# Patient Record
Sex: Male | Born: 1975 | Race: White | Hispanic: No | Marital: Married | Smoking: Never smoker
Health system: Southern US, Community
[De-identification: ages and names within clinical notes are randomized; demographics above are authoritative.]

## PROBLEM LIST (undated history)

## (undated) HISTORY — PX: ELBOW SURGERY: SHX618

## (undated) HISTORY — PX: BACK SURGERY: SHX140

## (undated) HISTORY — PX: FRACTURE SURGERY: SHX138

---

## 1998-10-27 ENCOUNTER — Ambulatory Visit (HOSPITAL_BASED_OUTPATIENT_CLINIC_OR_DEPARTMENT_OTHER): Admission: RE | Admit: 1998-10-27 | Discharge: 1998-10-27 | Payer: Self-pay | Admitting: Orthopedic Surgery

## 2005-08-08 ENCOUNTER — Observation Stay (HOSPITAL_COMMUNITY): Admission: EM | Admit: 2005-08-08 | Discharge: 2005-08-08 | Payer: Self-pay | Admitting: Emergency Medicine

## 2013-01-13 ENCOUNTER — Encounter (HOSPITAL_BASED_OUTPATIENT_CLINIC_OR_DEPARTMENT_OTHER): Payer: Self-pay | Admitting: *Deleted

## 2013-01-13 ENCOUNTER — Emergency Department (HOSPITAL_BASED_OUTPATIENT_CLINIC_OR_DEPARTMENT_OTHER)
Admission: EM | Admit: 2013-01-13 | Discharge: 2013-01-13 | Disposition: A | Discharge status: Home / Self Care | Payer: BC Managed Care – PPO | Attending: Emergency Medicine | Admitting: Emergency Medicine

## 2013-01-13 DIAGNOSIS — R51 Headache: Secondary | ICD-10-CM | POA: Insufficient documentation

## 2013-01-13 DIAGNOSIS — Z8709 Personal history of other diseases of the respiratory system: Secondary | ICD-10-CM | POA: Insufficient documentation

## 2013-01-13 DIAGNOSIS — R110 Nausea: Secondary | ICD-10-CM | POA: Insufficient documentation

## 2013-01-13 MED ORDER — DIPHENHYDRAMINE HCL 50 MG/ML IJ SOLN
25.0000 mg | Freq: Once | INTRAMUSCULAR | Status: AC
  Administered 2013-01-13: 25 mg via INTRAVENOUS
  Filled 2013-01-13: qty 1

## 2013-01-13 MED ORDER — KETOROLAC TROMETHAMINE 30 MG/ML IJ SOLN
30.0000 mg | Freq: Once | INTRAMUSCULAR | Status: AC
  Administered 2013-01-13: 30 mg via INTRAVENOUS
  Filled 2013-01-13: qty 1

## 2013-01-13 MED ORDER — SODIUM CHLORIDE 0.9 % IV BOLUS (SEPSIS)
1000.0000 mL | Freq: Once | INTRAVENOUS | Status: AC
  Administered 2013-01-13: 1000 mL via INTRAVENOUS

## 2013-01-13 MED ORDER — AMOXICILLIN 500 MG PO CAPS: 500.0000 mg | ORAL_CAPSULE | Freq: Three times a day (TID) | ORAL | Status: DC

## 2013-01-13 MED ORDER — METOCLOPRAMIDE HCL 5 MG/ML IJ SOLN
10.0000 mg | Freq: Once | INTRAMUSCULAR | Status: AC
  Administered 2013-01-13: 10 mg via INTRAVENOUS
  Filled 2013-01-13: qty 2

## 2013-01-13 NOTE — ED Notes (Signed)
Pt describes headache "behind eyes" x 5 days. +nausea+flashes "unable to function"+PERL. States was dx'd last week with sinus inf and bronchitis. Given injection and told to use OTC meds, but not helping.

## 2013-01-13 NOTE — ED Provider Notes (Signed)
History  This chart was scribed for Ethelda Chick, MD by Shari Heritage, ED Scribe. The patient was seen in room MH03/MH03. Patient's care was started at 1520.   CSN: [id number]  Arrival date & time 01/13/13  1345   First MD Initiated Contact with Patient 01/13/13 1520      Chief Complaint  Patient presents with  . Headache     Patient is a 37 y.o. male presenting with headaches. The history is provided by the patient. No language interpreter was used.  Headache Pain location:  Frontal Radiates to:  Does not radiate Onset quality:  Gradual Duration:  5 days Timing:  Constant Progression:  Worsening Chronicity:  New Ineffective treatments:  NSAIDs Associated symptoms: nausea   Nausea:    Severity:  Mild   Duration:  5 days    HPI Comments: DAVAUGHN HILLYARD is a 37 y.o. male who presents to the Emergency Department complaining of gradually worsening, moderate to severe headache behind eyes that is worse on the right onset 5 days ago. There is associated photosensitivity and nausea. Patient states that he was treated recently for a sinus infection and bronchitis with azithromycin which he finished more than 1 week ago. Patient says that sinusitis symptoms improved. He states that his headache began 5 days ago and he returned to his PCP for follow up. He was given Ultram in his PCP's office and was instructed to take ibuprofen 2-3 times per day, but he states headaches have persisted and have actually gotten worse. Patient denies vomiting or fever. Patient reports no history of similar headaches. He does not smoke.  History reviewed. No pertinent past medical history.  Past Surgical History  Procedure Laterality Date  . Elbow surgery    . Fracture surgery      History reviewed. No pertinent family history.  History  Substance Use Topics  . Smoking status: Never Smoker   . Smokeless tobacco: Not on file  . Alcohol Use: Yes      Review of Systems  Gastrointestinal:  Positive for nausea.  Neurological: Positive for headaches.  All other systems reviewed and are negative.    Allergies  Review of patient's allergies indicates no known allergies.  Home Medications   Current Outpatient Rx  Name  Route  Sig  Dispense  Refill  . amoxicillin (AMOXIL) 500 MG capsule   Oral   Take 1 capsule (500 mg total) by mouth 3 (three) times daily.   63 capsule   0     Triage Vitals: BP 151/90  Pulse 79  Temp(Src) 98.4 F (36.9 C) (Oral)  Resp 16  Ht 6' (1.829 m)  Wt 205 lb (92.987 kg)  BMI 27.8 kg/m2  SpO2 100%  Physical Exam  Nursing note and vitals reviewed. Constitutional: He is oriented to person, place, and time. He appears well-developed and well-nourished.  HENT:  Head: Normocephalic and atraumatic.  Eyes: Conjunctivae and EOM are normal. Pupils are equal, round, and reactive to light.  Neck: Normal range of motion. Neck supple.  Cardiovascular: Normal rate, regular rhythm and normal heart sounds.   Pulmonary/Chest: Effort normal and breath sounds normal.  Abdominal: Soft. Bowel sounds are normal.  Musculoskeletal: Normal range of motion.  Neurological: He is alert and oriented to person, place, and time.  5/5 strength throughout. CN 2-12 tested and intact. Sensation intact.  Skin: Skin is warm and dry.  Psychiatric: He has a normal mood and affect.    ED Course  Procedures (including  critical care time) DIAGNOSTIC STUDIES: Oxygen Saturation is 100% on room air, normal by my interpretation.    COORDINATION OF CARE: 3:45 PM- Patient informed of current plan for treatment and evaluation and agrees with plan at this time.      Labs Reviewed - No data to display No results found.   1. Headache       MDM  Pt presenting with headache behind his eyes over the past several days.  Neurologic exam intact.  Has been treated with zpack for sinusitis approx 2 weeks ago.  Pt feels much improved after migraine cocktail.  Low suspicion for  [facility] or other intracranial pathology, no meningismus.  Will give course of amoxicillin x 3 weeks in case of sinusitis.  Discharged with strict return precautions.  Pt agreeable with plan.     I personally performed the services described in this documentation, which was scribed in my presence. The recorded information has been reviewed and is accurate.    Ethelda Chick, MD 01/13/13 [phone]

## 2013-05-13 ENCOUNTER — Other Ambulatory Visit: Payer: Self-pay | Admitting: Neurological Surgery

## 2013-05-13 DIAGNOSIS — M545 Low back pain: Secondary | ICD-10-CM

## 2013-05-13 DIAGNOSIS — M5432 Sciatica, left side: Secondary | ICD-10-CM

## 2013-05-15 ENCOUNTER — Other Ambulatory Visit: Payer: BC Managed Care – PPO

## 2020-06-19 ENCOUNTER — Emergency Department (HOSPITAL_BASED_OUTPATIENT_CLINIC_OR_DEPARTMENT_OTHER): Payer: BC Managed Care – PPO

## 2020-06-19 ENCOUNTER — Other Ambulatory Visit: Payer: Self-pay

## 2020-06-19 ENCOUNTER — Encounter (HOSPITAL_BASED_OUTPATIENT_CLINIC_OR_DEPARTMENT_OTHER): Payer: Self-pay | Admitting: Emergency Medicine

## 2020-06-19 ENCOUNTER — Emergency Department (HOSPITAL_BASED_OUTPATIENT_CLINIC_OR_DEPARTMENT_OTHER)
Admission: EM | Admit: 2020-06-19 | Discharge: 2020-06-19 | Disposition: A | Discharge status: Home / Self Care | Payer: BC Managed Care – PPO | Attending: Emergency Medicine | Admitting: Emergency Medicine

## 2020-06-19 DIAGNOSIS — K529 Noninfective gastroenteritis and colitis, unspecified: Secondary | ICD-10-CM | POA: Diagnosis not present

## 2020-06-19 DIAGNOSIS — R1084 Generalized abdominal pain: Secondary | ICD-10-CM | POA: Diagnosis present

## 2020-06-19 LAB — COMPREHENSIVE METABOLIC PANEL
ALT: 25 U/L (ref 0–44)
AST: 21 U/L (ref 15–41)
Albumin: 4.5 g/dL (ref 3.5–5.0)
Alkaline Phosphatase: 41 U/L (ref 38–126)
Anion gap: 10 (ref 5–15)
BUN: 25 mg/dL — ABNORMAL HIGH (ref 6–20)
CO2: 29 mmol/L (ref 22–32)
Calcium: 9.3 mg/dL (ref 8.9–10.3)
Chloride: 101 mmol/L (ref 98–111)
Creatinine, Ser: 1.26 mg/dL — ABNORMAL HIGH (ref 0.61–1.24)
GFR calc Af Amer: >60 mL/min (ref >60)
GFR calc non Af Amer: >60 mL/min (ref >60)
Glucose, Bld: 105 mg/dL — ABNORMAL HIGH (ref 70–99)
Potassium: 4 mmol/L (ref 3.5–5.1)
Sodium: 140 mmol/L (ref 135–145)
Total Bilirubin: 0.4 mg/dL (ref 0.3–1.2)
Total Protein: 7 g/dL (ref 6.5–8.1)

## 2020-06-19 LAB — URINALYSIS, ROUTINE W REFLEX MICROSCOPIC
Bilirubin Urine: NEGATIVE
Glucose, UA: NEGATIVE mg/dL
Hgb urine dipstick: NEGATIVE
Ketones, ur: NEGATIVE mg/dL
Leukocytes,Ua: NEGATIVE
Nitrite: NEGATIVE
Protein, ur: NEGATIVE mg/dL
Specific Gravity, Urine: >1.03 — ABNORMAL HIGH (ref 1.005–1.030)
pH: 6 (ref 5.0–8.0)

## 2020-06-19 LAB — CBC
HCT: 45.6 % (ref 39.0–52.0)
Hemoglobin: 15.4 g/dL (ref 13.0–17.0)
MCH: 30.8 pg (ref 26.0–34.0)
MCHC: 33.8 g/dL (ref 30.0–36.0)
MCV: 91.2 fL (ref 80.0–100.0)
Platelets: 223 10*3/uL (ref 150–400)
RBC: 5 MIL/uL (ref 4.22–5.81)
RDW: 12.5 % (ref 11.5–15.5)
WBC: 12.3 10*3/uL — ABNORMAL HIGH (ref 4.0–10.5)
nRBC: 0 % (ref 0.0–0.2)

## 2020-06-19 LAB — DIFFERENTIAL
Abs Immature Granulocytes: 0.04 10*3/uL (ref 0.00–0.07)
Basophils Absolute: 0.1 10*3/uL (ref 0.0–0.1)
Basophils Relative: 1 %
Eosinophils Absolute: 0.2 10*3/uL (ref 0.0–0.5)
Eosinophils Relative: 2 %
Immature Granulocytes: 0 %
Lymphocytes Relative: 10 %
Lymphs Abs: 1.2 10*3/uL (ref 0.7–4.0)
Monocytes Absolute: 1.4 10*3/uL — ABNORMAL HIGH (ref 0.1–1.0)
Monocytes Relative: 11 %
Neutro Abs: 9.4 10*3/uL — ABNORMAL HIGH (ref 1.7–7.7)
Neutrophils Relative %: 76 %

## 2020-06-19 LAB — LIPASE, BLOOD: Lipase: 30 U/L (ref 11–51)

## 2020-06-19 MED ORDER — ONDANSETRON 8 MG PO TBDP: 8.0000 mg | ORAL_TABLET | Freq: Three times a day (TID) | ORAL | 0 refills | Status: DC | PRN

## 2020-06-19 MED ORDER — ONDANSETRON HCL 4 MG/2ML IJ SOLN
4.0000 mg | Freq: Once | INTRAMUSCULAR | Status: AC
  Administered 2020-06-19: 4 mg via INTRAVENOUS
  Filled 2020-06-19: qty 2

## 2020-06-19 MED ORDER — DICYCLOMINE HCL 20 MG PO TABS: 20.0000 mg | ORAL_TABLET | Freq: Three times a day (TID) | ORAL | 0 refills | Status: DC

## 2020-06-19 MED ORDER — IOHEXOL 300 MG/ML  SOLN
100.0000 mL | Freq: Once | INTRAMUSCULAR | Status: AC | PRN
  Administered 2020-06-19: 100 mL via INTRAVENOUS

## 2020-06-19 MED ORDER — SODIUM CHLORIDE 0.9 % IV BOLUS
1000.0000 mL | Freq: Once | INTRAVENOUS | Status: AC
  Administered 2020-06-19: 1000 mL via INTRAVENOUS

## 2020-06-19 MED ORDER — DICYCLOMINE HCL 10 MG/ML IM SOLN
20.0000 mg | Freq: Once | INTRAMUSCULAR | Status: AC
  Administered 2020-06-19: 20 mg via INTRAMUSCULAR
  Filled 2020-06-19: qty 2

## 2020-06-19 NOTE — ED Provider Notes (Signed)
[facility] Provider Note: Lowella Dell, MD, FACEP  CSN: [id number] MRN: [id number] ARRIVAL: 06/19/20 at 0359 ROOM: MH06/MH06   CHIEF COMPLAINT  Abdominal Pain   HISTORY OF PRESENT ILLNESS  06/19/20 5:38 AM Christopher Mccarty is a 44 y.o. male who developed generalized abdominal pain this morning about 12:30 AM.  The pain is periumbilical and comes and spasms.  The spasms last 1 to several minutes.  When they occur the pain is severe and his abdomen is very tender.  Between episodes his pain is much milder.  He has had nausea from the pain but no vomiting.  He has had some mild diarrhea which does not relieve his pain.  He has not had a fever.  History reviewed. No pertinent past medical history.  Past Surgical History:  Procedure Laterality Date  . ELBOW SURGERY    . FRACTURE SURGERY      No family history on file.  Social History   Tobacco Use  . Smoking status: Never Smoker  . Smokeless tobacco: Never Used  Substance Use Topics  . Alcohol use: Yes  . Drug use: No    Prior to Admission medications   Medication Sig Start Date End Date Taking? Authorizing Provider  dicyclomine (BENTYL) 20 MG tablet Take 1 tablet (20 mg total) by mouth 4 (four) times daily -  before meals and at bedtime. 06/19/20   Claudie Brickhouse, Jonny Ruiz, MD  ondansetron (ZOFRAN ODT) 8 MG disintegrating tablet Take 1 tablet (8 mg total) by mouth every 8 (eight) hours as needed for nausea or vomiting. 06/19/20   Sehar Sedano, Jonny Ruiz, MD    Allergies Patient has no known allergies.   REVIEW OF SYSTEMS  Negative except as noted here or in the History of Present Illness.   PHYSICAL EXAMINATION  Initial Vital Signs Blood pressure (!) 149/98, pulse 65, temperature 98.5 F (36.9 C), temperature source Oral, resp. rate 20, weight (!) 92.8 kg, SpO2 100 %.  Examination General: Well-developed, well-nourished male in no acute distress; appearance consistent with age of record HENT: normocephalic; atraumatic Eyes:  pupils equal, round and reactive to light; extraocular muscles intact Neck: supple Heart: regular rate and rhythm Lungs: clear to auscultation bilaterally Abdomen: soft; nondistended; mild periumbilical tenderness; no masses or hepatosplenomegaly; bowel sounds hypoactive Extremities: No deformity; full range of motion; pulses normal Neurologic: Awake, alert and oriented; motor function intact in all extremities and symmetric; no facial droop Skin: Warm and dry Psychiatric: Normal mood and affect   RESULTS  Summary of this visit's results, reviewed and interpreted by myself:   EKG Interpretation  Date/Time:    Ventricular Rate:    PR Interval:    QRS Duration:   QT Interval:    QTC Calculation:   R Axis:     Text Interpretation:        Laboratory Studies: Results for orders placed or performed during the hospital encounter of 06/19/20 (from the past 24 hour(s))  Lipase, blood     Status: None   Collection Time: 06/19/20  4:13 AM  Result Value Ref Range   Lipase 30 11 - 51 U/L  Comprehensive metabolic panel     Status: Abnormal   Collection Time: 06/19/20  4:13 AM  Result Value Ref Range   Sodium 140 135 - 145 mmol/L   Potassium 4.0 3.5 - 5.1 mmol/L   Chloride 101 98 - 111 mmol/L   CO2 29 22 - 32 mmol/L   Glucose, Bld 105 (H) 70 - 99  mg/dL   BUN 25 (H) 6 - 20 mg/dL   Creatinine, Ser 3.01 (H) 0.61 - 1.24 mg/dL   Calcium 9.3 8.9 - 60.1 mg/dL   Total Protein 7.0 6.5 - 8.1 g/dL   Albumin 4.5 3.5 - 5.0 g/dL   AST 21 15 - 41 U/L   ALT 25 0 - 44 U/L   Alkaline Phosphatase 41 38 - 126 U/L   Total Bilirubin 0.4 0.3 - 1.2 mg/dL   GFR calc non Af Amer >60 >60 mL/min   GFR calc Af Amer >60 >60 mL/min   Anion gap 10 5 - 15  CBC     Status: Abnormal   Collection Time: 06/19/20  4:13 AM  Result Value Ref Range   WBC 12.3 (H) 4.0 - 10.5 K/uL   RBC 5.00 4.22 - 5.81 MIL/uL   Hemoglobin 15.4 13.0 - 17.0 g/dL   HCT 09.3 39 - 52 %   MCV 91.2 80.0 - 100.0 fL   MCH 30.8 26.0 -  34.0 pg   MCHC 33.8 30.0 - 36.0 g/dL   RDW 23.5 57.3 - 22.0 %   Platelets 223 150 - 400 K/uL   nRBC 0.0 0.0 - 0.2 %  Urinalysis, Routine w reflex microscopic     Status: Abnormal   Collection Time: 06/19/20  4:13 AM  Result Value Ref Range   Color, Urine YELLOW YELLOW   APPearance CLEAR CLEAR   Specific Gravity, Urine >1.030 (H) 1.005 - 1.030   pH 6.0 5.0 - 8.0   Glucose, UA NEGATIVE NEGATIVE mg/dL   Hgb urine dipstick NEGATIVE NEGATIVE   Bilirubin Urine NEGATIVE NEGATIVE   Ketones, ur NEGATIVE NEGATIVE mg/dL   Protein, ur NEGATIVE NEGATIVE mg/dL   Nitrite NEGATIVE NEGATIVE   Leukocytes,Ua NEGATIVE NEGATIVE  Differential     Status: Abnormal   Collection Time: 06/19/20  4:13 AM  Result Value Ref Range   Neutrophils Relative % 76 %   Neutro Abs 9.4 (H) 1.7 - 7.7 K/uL   Lymphocytes Relative 10 %   Lymphs Abs 1.2 0.7 - 4.0 K/uL   Monocytes Relative 11 %   Monocytes Absolute 1.4 (H) 0 - 1 K/uL   Eosinophils Relative 2 %   Eosinophils Absolute 0.2 0 - 0 K/uL   Basophils Relative 1 %   Basophils Absolute 0.1 0 - 0 K/uL   Immature Granulocytes 0 %   Abs Immature Granulocytes 0.04 0.00 - 0.07 K/uL   Imaging Studies: CT ABDOMEN PELVIS W CONTRAST  Result Date: 06/19/2020 CLINICAL DATA:  Acute periumbilical pain beginning last night. Leukocytosis. EXAM: CT ABDOMEN AND PELVIS WITH CONTRAST TECHNIQUE: Multidetector CT imaging of the abdomen and pelvis was performed using the standard protocol following bolus administration of intravenous contrast. CONTRAST:  OMNIPAQUE IOHEXOL 300 MG/ML  SOLN COMPARISON:  None. FINDINGS: Lower chest: Clear lung bases. Hepatobiliary: No focal liver abnormality is seen. A gallbladder Phrygian cap is incidentally noted without evidence of gallstones or cholecystitis. There is no biliary dilatation. Pancreas: Unremarkable. Spleen: Unremarkable. Adrenals/Urinary Tract: Unremarkable adrenal glands. No evidence of renal mass, calculi, or hydronephrosis.  Unremarkable bladder. Stomach/Bowel: The stomach is unremarkable. There is no evidence of bowel obstruction. There is diffuse circumferential wall thickening of the distal ileum. The appendix is unremarkable. Vascular/Lymphatic: No significant vascular findings are present. No enlarged abdominal or pelvic lymph nodes. Reproductive: Unremarkable prostate. Other: No intraperitoneal free fluid or free air. No abdominal wall hernia. Musculoskeletal: No acute osseous abnormality or suspicious osseous lesion. Thoracolumbar  spondylosis and facet arthrosis with asymmetric left-sided neural foraminal stenosis at T11-12 and L5-S1. IMPRESSION: Diffuse wall thickening of the distal ileum which may reflect an infectious or inflammatory ileitis. Normal appendix. Electronically Signed   By: Sebastian Ache M.D.   On: 06/19/2020 07:04    ED COURSE and MDM  Nursing notes, initial and subsequent vitals signs, including pulse oximetry, reviewed and interpreted by myself.  Vitals:   06/19/20 0410 06/19/20 0644  BP: (!) 149/98 (!) 136/88  Pulse: 65 68  Resp: 20 18  Temp: 98.5 F (36.9 C)   TempSrc: Oral   SpO2: 100% 100%  Weight: (!) 92.8 kg    Medications  sodium chloride 0.9 % bolus 1,000 mL (1,000 mLs Intravenous New Bag/Given 06/19/20 0641)  ondansetron (ZOFRAN) injection 4 mg (4 mg Intravenous Given 06/19/20 0639)  dicyclomine (BENTYL) injection 20 mg (20 mg Intramuscular Given 06/19/20 0648)  iohexol (OMNIPAQUE) 300 MG/ML solution 100 mL (100 mLs Intravenous Contrast Given 06/19/20 0620)   7:11 AM Patient spasm significantly improved with IM Bentyl.  Nausea improved with Zofran.  CT findings were discussed with the patient.  He is comfortable going home and returning if symptoms worsen.  He was advised that differential diagnosis includes Crohn's disease but the CT findings were nonspecific.   PROCEDURES  Procedures   ED DIAGNOSES     ICD-10-CM   1. Enteritis  K52.9        Kijuana Ruppel, Cire, MD 06/19/20  [phone]

## 2020-06-19 NOTE — ED Triage Notes (Signed)
Pt reports generalized abdominal pain and some diarrhea. Reports onset at approx 0030.

## 2021-06-12 IMAGING — CT CT ABD-PELV W/ CM
2 of 5 series · 16 of 46 positions shown, 18 images · IV contrast (Omnipaque)
Comparison: None.

CLINICAL DATA: Acute periumbilical pain beginning last night.
Leukocytosis.

EXAM:
CT ABDOMEN AND PELVIS WITH CONTRAST
TECHNIQUE: Multidetector CT imaging of the abdomen and pelvis was performed
using the standard protocol following bolus administration of
intravenous contrast.
CONTRAST:  100mL OMNIPAQUE IOHEXOL 300 MG/ML  SOLN

[Series 2: axial st · axial · 0.89mm/px · z∈[-569,-114]mm · 13 of 103 slices shown, 15 images]
[im 6/103  soft-tissue]
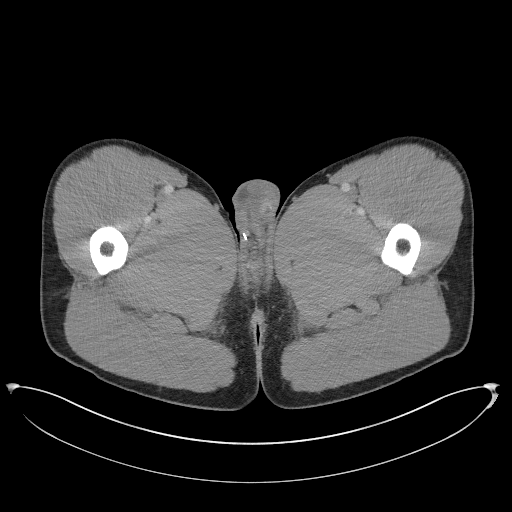
[im 6/103  bone]
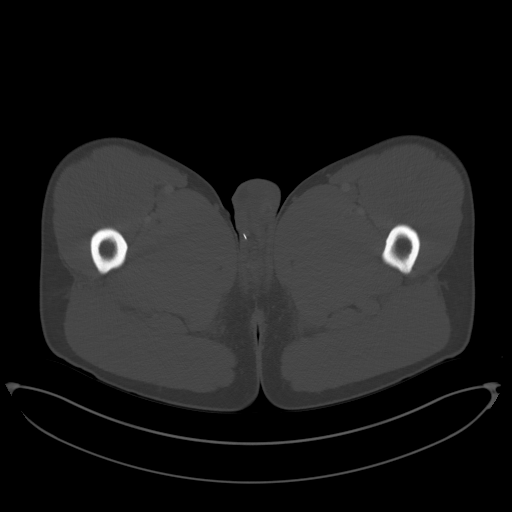
[im 16/103  soft-tissue]
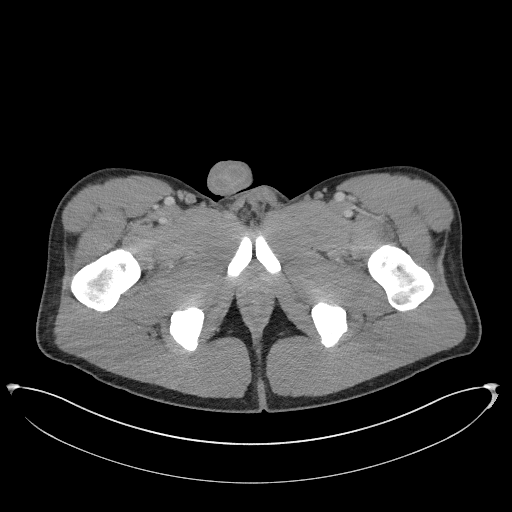
[im 21/103  soft-tissue]
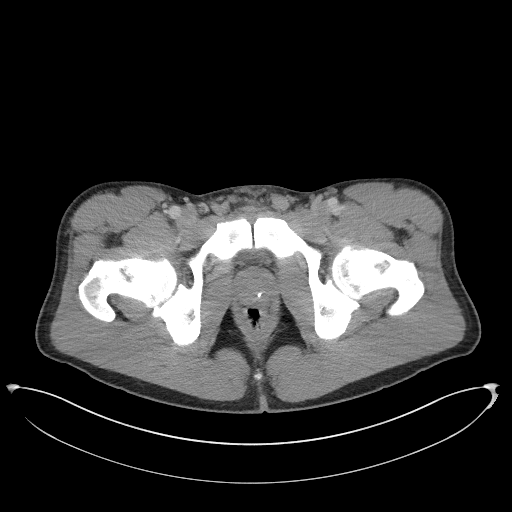
[im 31/103  soft-tissue]
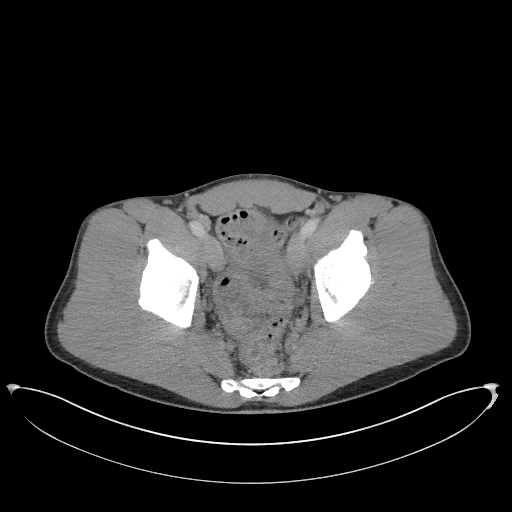
[im 36/103  soft-tissue]
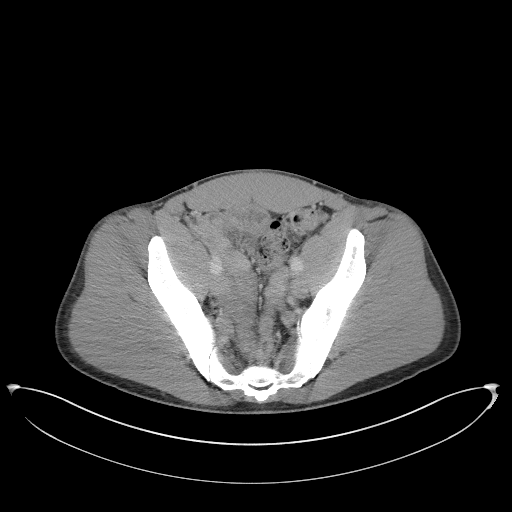
[im 46/103  soft-tissue]
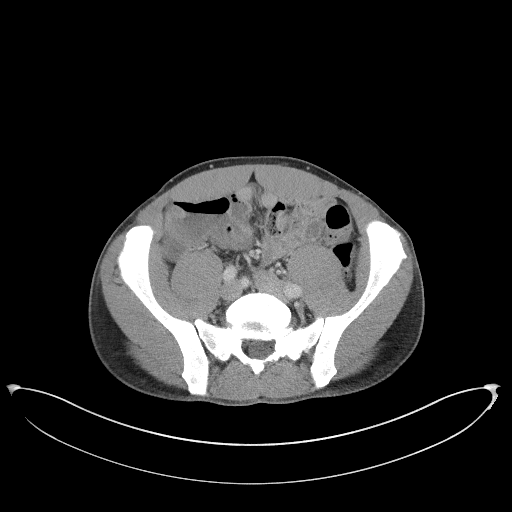
[im 52/103  soft-tissue]
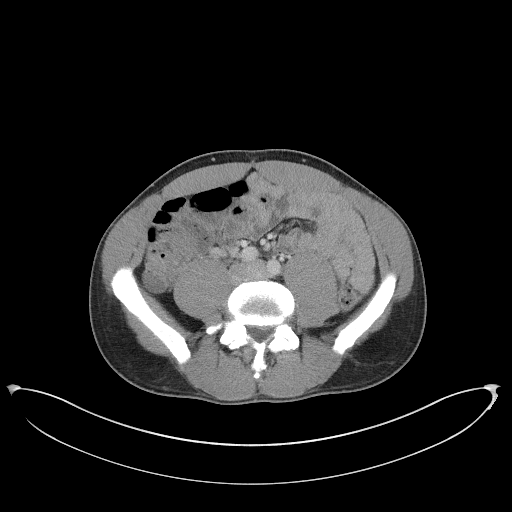
[im 57/103  soft-tissue]
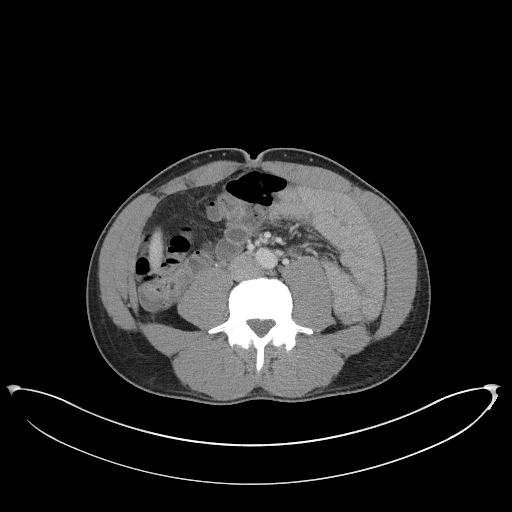
[im 67/103  soft-tissue]
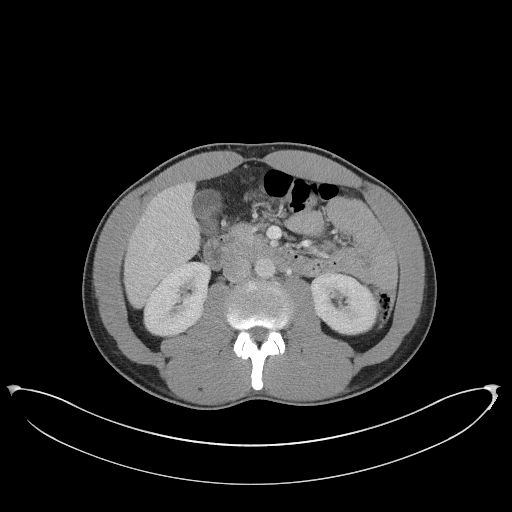
[im 67/103  bone]
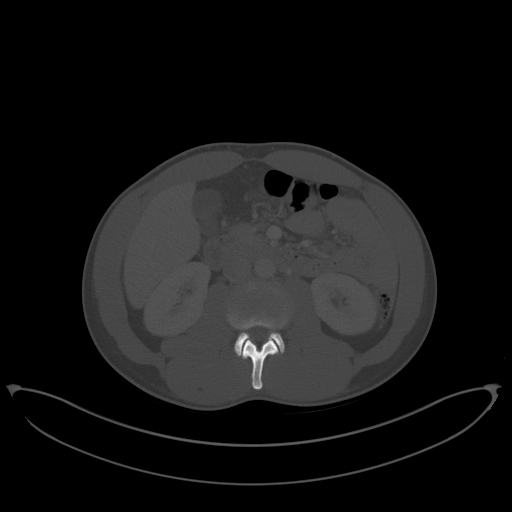
[im 72/103  soft-tissue]
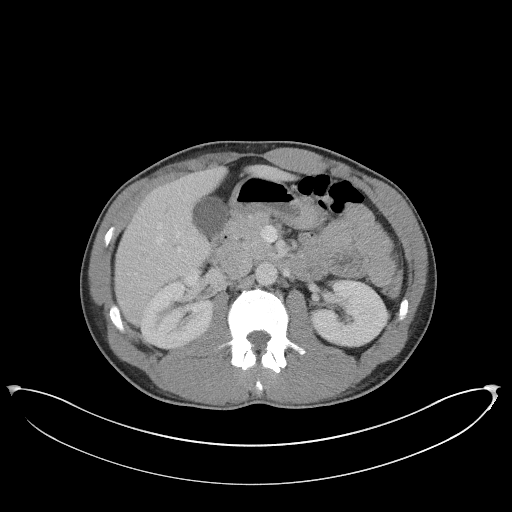
[im 82/103  soft-tissue]
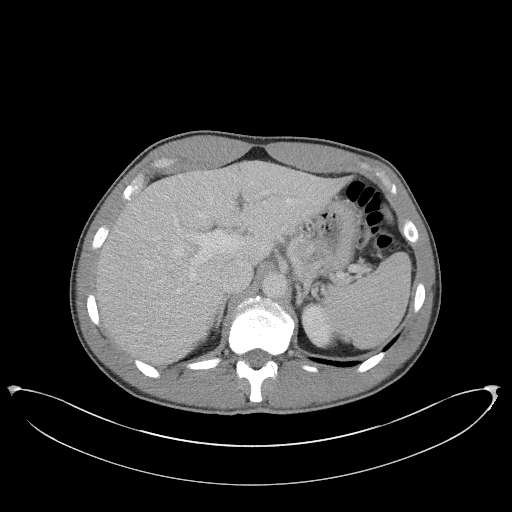
[im 87/103  soft-tissue]
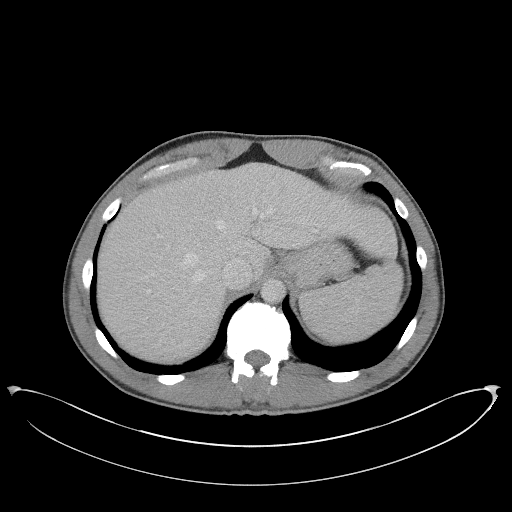
[im 97/103  soft-tissue]
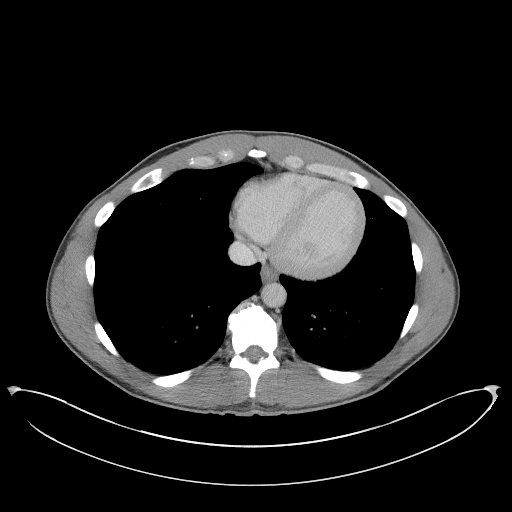

[Series 5: coronal st · coronal · 0.73mm/px · 3 of 80 slices shown]
[im 27/80  soft-tissue]
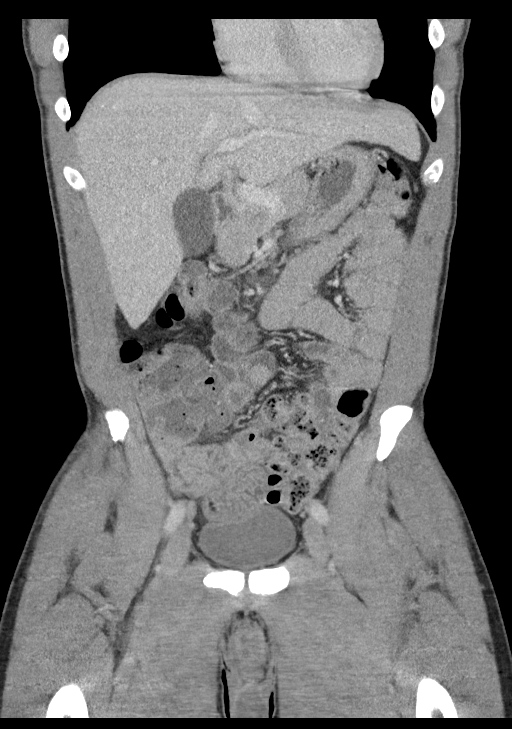
[im 36/80  soft-tissue]
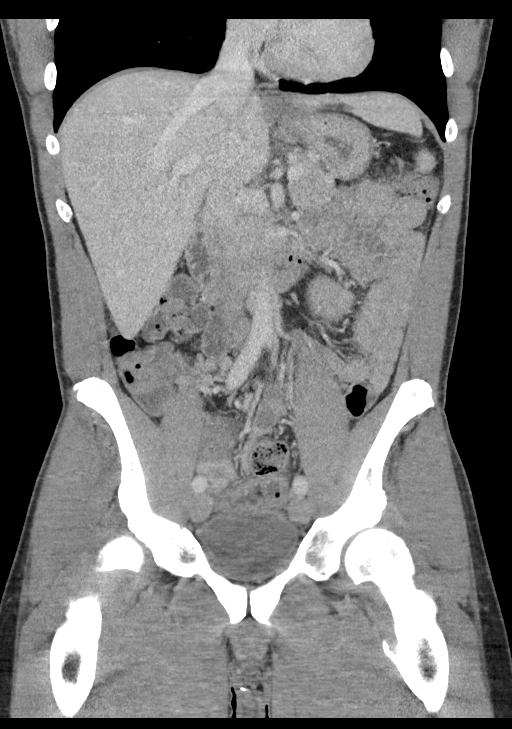
[im 44/80  soft-tissue]
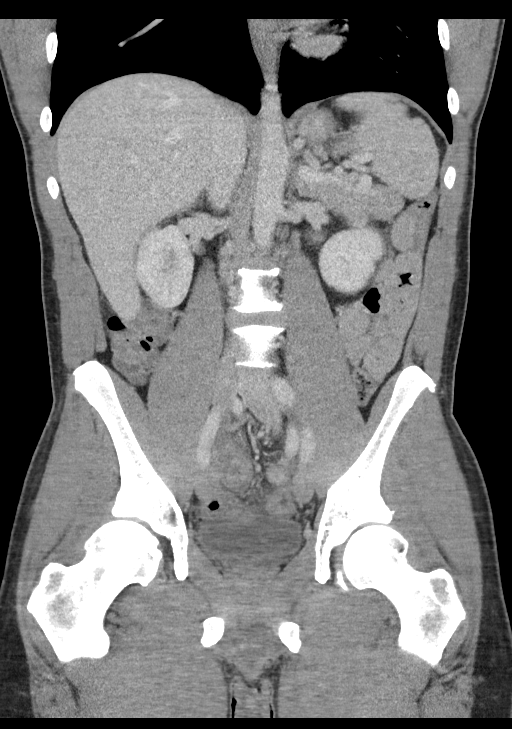

[16 of 46 positions shown; findings below may reference images not displayed]

FINDINGS: Lower chest: Clear lung bases.

Hepatobiliary: No focal liver abnormality is seen. A gallbladder
Phrygian cap is incidentally noted without evidence of gallstones or
cholecystitis. There is no biliary dilatation.

Pancreas: Unremarkable.

Spleen: Unremarkable.

Adrenals/Urinary Tract: Unremarkable adrenal glands. No evidence of
renal mass, calculi, or hydronephrosis. Unremarkable bladder.

Stomach/Bowel: The stomach is unremarkable. There is no evidence of
bowel obstruction. There is diffuse circumferential wall thickening
of the distal ileum. The appendix is unremarkable.

Vascular/Lymphatic: No significant vascular findings are present. No
enlarged abdominal or pelvic lymph nodes.

Reproductive: Unremarkable prostate.

Other: No intraperitoneal free fluid or free air. No abdominal wall
hernia.

Musculoskeletal: No acute osseous abnormality or suspicious osseous
lesion. Thoracolumbar spondylosis and facet arthrosis with
asymmetric left-sided neural foraminal stenosis at T11-12 and L5-S1.
IMPRESSION: Diffuse wall thickening of the distal ileum which may reflect an
infectious or inflammatory ileitis. Normal appendix.

## 2022-12-31 ENCOUNTER — Emergency Department (HOSPITAL_COMMUNITY): Payer: BC Managed Care – PPO

## 2022-12-31 ENCOUNTER — Emergency Department (HOSPITAL_COMMUNITY)
Admission: EM | Admit: 2022-12-31 | Discharge: 2022-12-31 | Disposition: A | Discharge status: Home / Self Care | Payer: BC Managed Care – PPO | Attending: Emergency Medicine | Admitting: Emergency Medicine

## 2022-12-31 ENCOUNTER — Encounter (HOSPITAL_COMMUNITY): Payer: Self-pay

## 2022-12-31 DIAGNOSIS — R112 Nausea with vomiting, unspecified: Secondary | ICD-10-CM | POA: Diagnosis not present

## 2022-12-31 DIAGNOSIS — R4182 Altered mental status, unspecified: Secondary | ICD-10-CM

## 2022-12-31 DIAGNOSIS — F1210 Cannabis abuse, uncomplicated: Secondary | ICD-10-CM | POA: Insufficient documentation

## 2022-12-31 LAB — COMPREHENSIVE METABOLIC PANEL
ALT: 26 U/L (ref 0–44)
AST: 28 U/L (ref 15–41)
Albumin: 4.2 g/dL (ref 3.5–5.0)
Alkaline Phosphatase: 41 U/L (ref 38–126)
Anion gap: 10 (ref 5–15)
BUN: 19 mg/dL (ref 6–20)
CO2: 23 mmol/L (ref 22–32)
Calcium: 9.3 mg/dL (ref 8.9–10.3)
Chloride: 102 mmol/L (ref 98–111)
Creatinine, Ser: 1.16 mg/dL (ref 0.61–1.24)
GFR, Estimated: >60 mL/min (ref >60)
Glucose, Bld: 135 mg/dL — ABNORMAL HIGH (ref 70–99)
Potassium: 4.3 mmol/L (ref 3.5–5.1)
Sodium: 135 mmol/L (ref 135–145)
Total Bilirubin: 0.6 mg/dL (ref 0.3–1.2)
Total Protein: 6.5 g/dL (ref 6.5–8.1)

## 2022-12-31 LAB — ETHANOL: Alcohol, Ethyl (B): <10 mg/dL (ref <10)

## 2022-12-31 LAB — CBC
HCT: 43.4 % (ref 39.0–52.0)
Hemoglobin: 14.6 g/dL (ref 13.0–17.0)
MCH: 30.8 pg (ref 26.0–34.0)
MCHC: 33.6 g/dL (ref 30.0–36.0)
MCV: 91.6 fL (ref 80.0–100.0)
Platelets: 207 10*3/uL (ref 150–400)
RBC: 4.74 MIL/uL (ref 4.22–5.81)
RDW: 12.6 % (ref 11.5–15.5)
WBC: 8.4 10*3/uL (ref 4.0–10.5)
nRBC: 0 % (ref 0.0–0.2)

## 2022-12-31 LAB — I-STAT VENOUS BLOOD GAS, ED
Acid-Base Excess: 0 mmol/L (ref 0.0–2.0)
Bicarbonate: 25.4 mmol/L (ref 20.0–28.0)
Calcium, Ion: 1.21 mmol/L (ref 1.15–1.40)
HCT: 43 % (ref 39.0–52.0)
Hemoglobin: 14.6 g/dL (ref 13.0–17.0)
O2 Saturation: 75 %
Potassium: 4.3 mmol/L (ref 3.5–5.1)
Sodium: 138 mmol/L (ref 135–145)
TCO2: 27 mmol/L (ref 22–32)
pCO2, Ven: 45.1 mmHg (ref 44–60)
pH, Ven: 7.359 (ref 7.25–7.43)
pO2, Ven: 42 mmHg (ref 32–45)

## 2022-12-31 LAB — RAPID URINE DRUG SCREEN, HOSP PERFORMED
Amphetamines: NOT DETECTED
Barbiturates: NOT DETECTED
Benzodiazepines: NOT DETECTED
Cocaine: NOT DETECTED
Opiates: NOT DETECTED
Tetrahydrocannabinol: POSITIVE — AB

## 2022-12-31 MED ORDER — LACTATED RINGERS IV BOLUS
1000.0000 mL | Freq: Once | INTRAVENOUS | Status: AC
  Administered 2022-12-31: 1000 mL via INTRAVENOUS

## 2022-12-31 MED ORDER — ONDANSETRON HCL 4 MG/2ML IJ SOLN
4.0000 mg | Freq: Once | INTRAMUSCULAR | Status: AC
  Administered 2022-12-31: 4 mg via INTRAVENOUS
  Filled 2022-12-31: qty 2

## 2022-12-31 NOTE — ED Triage Notes (Addendum)
Pt states he took a delta 8 gummy last night for the first time; woke up at 0200 vomiting, woke up on floor after going to restroom; wife states pt had a seizure, no hx of same; pt states "it's hard to focus"; denies pain; pt a and o x 4, answering questions appropriately

## 2022-12-31 NOTE — ED Provider Notes (Addendum)
[facility] Provider Note   CSN: [id number] Arrival date & time: 12/31/22  4098     History  Chief Complaint  Patient presents with   Altered Mental Status    Christopher PICKERAL is a 47 y.o. male.  HPI 48 year old male who presents today with nausea vomiting and sleepiness as well as some confusion.  He reports he took a THC gummy last night.  He has taken some for sleep recently.  This was from a new place he was not entirely sure was in it.  He apparently was slightly dizzy prior to bedtime.  His wife states that he woke up in the melanite and vomited.  He was then sitting on the floor incontinent of bowel and his head forward before vomiting.  He then had multiple episodes of vomiting.  He appeared to be confused and sleepy.  There is no report of actual seizure.  He has had no prior seizures, fever, dyspnea, or substance use.     Home Medications Prior to Admission medications   Medication Sig Start Date End Date Taking? Authorizing Provider  clotrimazole (LOTRIMIN AF) 1 % cream Apply 1 Application topically daily as needed (toe fungus).   Yes [provider]  Multiple Vitamins-Minerals (MULTIVITAMIN WITH MINERALS) tablet Take 1 tablet by mouth daily.   Yes [provider]  Omega-3 Fatty Acids (FISH OIL PO) Take 1 capsule by mouth daily.   Yes [provider]      Allergies    Patient has no known allergies.    Review of Systems   Review of Systems  Physical Exam Updated Vital Signs BP 126/72   Pulse 60   Temp 98 F (36.7 C) (Oral)   Resp 20   Ht 1.829 m (6')   Wt 95.3 kg   SpO2 100%   BMI 28.48 kg/m  Physical Exam Vitals reviewed.  Constitutional:      Appearance: Normal appearance.  HENT:     Head: Normocephalic.     Right Ear: External ear normal.     Left Ear: External ear normal.     Nose: Nose normal.     Mouth/Throat:     Pharynx: Oropharynx is clear.  Eyes:     Extraocular  Movements: Extraocular movements intact.     Pupils: Pupils are equal, round, and reactive to light.  Cardiovascular:     Rate and Rhythm: Normal rate and regular rhythm.     Pulses: Normal pulses.  Pulmonary:     Effort: Pulmonary effort is normal.  Abdominal:     General: Abdomen is flat. Bowel sounds are normal.  Musculoskeletal:        General: Normal range of motion.     Cervical back: Normal range of motion.  Skin:    General: Skin is warm and dry.     Capillary Refill: Capillary refill takes less than 2 seconds.  Neurological:     General: No focal deficit present.     Mental Status: He is alert.     Cranial Nerves: No cranial nerve deficit.     Sensory: No sensory deficit.     Motor: No weakness.     Coordination: Coordination normal.     Gait: Gait normal.     Deep Tendon Reflexes: Reflexes normal.     ED Results / Procedures / Treatments   Labs (all labs ordered are listed, but only abnormal results are displayed) Labs Reviewed  RAPID  URINE DRUG SCREEN, HOSP PERFORMED - Abnormal; Notable for the following components:      Result Value   Tetrahydrocannabinol POSITIVE (*)    All other components within normal limits  COMPREHENSIVE METABOLIC PANEL - Abnormal; Notable for the following components:   Glucose, Bld 135 (*)    All other components within normal limits  CBC  ETHANOL  BLOOD GAS, VENOUS  I-STAT VENOUS BLOOD GAS, ED    EKG None  Radiology CT Head Wo Contrast  Result Date: 12/31/2022 CLINICAL DATA:  Seizure and vomiting after eating abdominal. EXAM: CT HEAD WITHOUT CONTRAST TECHNIQUE: Contiguous axial images were obtained from the base of the skull through the vertex without intravenous contrast. RADIATION DOSE REDUCTION: This exam was performed according to the departmental dose-optimization program which includes automated exposure control, adjustment of the mA and/or kV according to patient size and/or use of iterative reconstruction technique.  COMPARISON:  None Available. FINDINGS: Brain: No evidence of acute infarction, hemorrhage, hydrocephalus, extra-axial collection or mass lesion/mass effect. Vascular: No hyperdense vessel or unexpected calcification. Skull: Normal. Negative for fracture or focal lesion. Sinuses/Orbits: No acute finding. IMPRESSION: Negative head CT. Electronically Signed   By: Jorje Guild M.D.   On: 12/31/2022 10:24   DG Chest Port 1 View  Result Date: 12/31/2022 CLINICAL DATA:  Altered mental status. Patient took delta 8 gummy last night for the first time. Woke up on floor after going to restroom. Patient's wife states patient had seizure. EXAM: PORTABLE CHEST 1 VIEW COMPARISON:  None Available. FINDINGS: Heart size and mediastinal contours are normal. No pleural effusion or edema. No airspace opacities identified. Visualized osseous structures are unremarkable. IMPRESSION: No active disease. Electronically Signed   By: Kerby Moors M.D.   On: 12/31/2022 10:19    Procedures .Critical Care  Performed by: Pattricia Boss, MD Authorized by: Pattricia Boss, MD   Critical care provider statement:    Critical care time (minutes):  30   Critical care was necessary to treat or prevent imminent or life-threatening deterioration of the following conditions:  Toxidrome   Critical care was time spent personally by me on the following activities:  Development of treatment plan with patient or surrogate, discussions with consultants, evaluation of patient's response to treatment, examination of patient, ordering and review of laboratory studies, ordering and review of radiographic studies, ordering and performing treatments and interventions, pulse oximetry, re-evaluation of patient's condition and review of old charts     Medications Ordered in ED Medications  lactated ringers bolus 1,000 mL (0 mLs Intravenous Stopped 12/31/22 1144)  ondansetron (ZOFRAN) injection 4 mg (4 mg Intravenous Given 12/31/22 1025)    ED Course/  Medical Decision Making/ A&P Clinical Course as of 12/31/22 1438  Sat Dec 31, 2022  1433 Urine drug screen positive for THC otherwise within normal limits [DR]  3151 Complete metabolic panel reviewed interpreted significant for mild hyperglycemia 135 otherwise within normal limits [DR]  1433 Venous blood gas is significant for normal pH with normal CO2 [DR]  1433 Alcohol level is less than 10 which is within normal limits [DR]  1433 ET head reviewed interpreted within normal limits radiologist interpretation concurs [DR]  7616 Chest x-Etty Isaac reviewed interpreted within normal limits [DR]    Clinical Course User Index [DR] Pattricia Boss, MD                             Medical Decision Making Amount and/or Complexity of Data  Reviewed Labs: ordered. Radiology: ordered.  Risk Prescription drug management.  This patient presents to the ED for concern of altered mental status and possible seizure with THC ingestion, this involves an extensive number of treatment options, and is a complaint that carries with it a high risk of complications and morbidity.  The differential diagnosis includes toxicologic etiology THC and possible other substances, electrolyte abnormality, acute intracranial abnormality such as hemorrhage as well as concern for aspiration and situation with vomiting   Co morbidities that complicate the patient evaluation  None   Additional history obtained:  Additional history obtained from Discussed with wife via telephone External records from outside source obtained and reviewed including no old records reviewed   Lab Tests:  I Ordered, and personally interpreted labs.  The pertinent results include: Reviewed labs and urine drug screen which is positive for THC and otherwise within normal limits   Imaging Studies ordered:  I ordered imaging studies including reviewed head CT and chest x-Hinton Luellen with I independently visualized and interpreted imaging which showed no  evidence of acute abnormality I agree with the radiologist interpretation   Cardiac Monitoring: / EKG:  The patient was maintained on a cardiac monitor.  I personally viewed and interpreted the cardiac monitored which showed an underlying rhythm of: Sinus rhythm with normal heart rate and normal blood pressure throughout visit   Consultations Obtained:  I requested consultation with the none,  and discussed lab and imaging findings as well as pertinent plan - they recommend: Not applicable   Problem List / ED Course / Critical interventions / Medication management  Altered mental status nausea and vomiting I ordered medication including Zofran for IV fluids Reevaluation of the patient after these medicines showed that the patient improved I have reviewed the patients home medicines and have made adjustments as needed   Social Determinants of Health:  Patient with home support and access   Test / Admission - Considered:  Patient observed for several hours to assure that he is returning to baseline          Final Clinical Impression(s) / ED Diagnoses Final diagnoses:  Altered mental status, unspecified altered mental status type  Mild tetrahydrocannabinol (THC) abuse  Nausea and vomiting, unspecified vomiting type    Rx / DC Orders ED Discharge Orders     None         Pattricia Boss, MD 12/31/22 1437    Pattricia Boss, MD 12/31/22 1438

## 2022-12-31 NOTE — Discharge Instructions (Signed)
Please do not take any future THC The remainder of your evaluation including labs, urinalysis, and head CT were normal with the exception of urine drug screen being positive for THC. Drink plenty of fluids.  Be careful while you ambulate over the next 24 hours.
